# Patient Record
Sex: Male | Born: 1985 | Hispanic: Yes | Marital: Single | Smoking: Former smoker
Health system: Southern US, Community
[De-identification: ages and names within clinical notes are randomized; demographics above are authoritative.]

---
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
## 2008-04-11 ENCOUNTER — Emergency Department: Payer: Self-pay | Admitting: Emergency Medicine

## 2018-07-17 ENCOUNTER — Emergency Department
Admission: EM | Admit: 2018-07-17 | Discharge: 2018-07-17 | Payer: Self-pay | Attending: Student in an Organized Health Care Education/Training Program | Admitting: Student in an Organized Health Care Education/Training Program

## 2018-07-17 ENCOUNTER — Other Ambulatory Visit
Admission: RE | Admit: 2018-07-17 | Discharge: 2018-07-17 | Disposition: A | Discharge status: Home / Self Care | Payer: Self-pay | Attending: Family Medicine | Admitting: Family Medicine

## 2018-07-17 ENCOUNTER — Encounter: Payer: Self-pay | Admitting: Emergency Medicine

## 2018-07-17 ENCOUNTER — Other Ambulatory Visit: Payer: Self-pay

## 2018-07-17 DIAGNOSIS — Y999 Unspecified external cause status: Secondary | ICD-10-CM | POA: Insufficient documentation

## 2018-07-17 DIAGNOSIS — S0081XA Abrasion of other part of head, initial encounter: Secondary | ICD-10-CM | POA: Insufficient documentation

## 2018-07-17 DIAGNOSIS — Z87891 Personal history of nicotine dependence: Secondary | ICD-10-CM | POA: Insufficient documentation

## 2018-07-17 DIAGNOSIS — Y939 Activity, unspecified: Secondary | ICD-10-CM | POA: Insufficient documentation

## 2018-07-17 DIAGNOSIS — Y929 Unspecified place or not applicable: Secondary | ICD-10-CM | POA: Insufficient documentation
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
## 2018-07-17 NOTE — ED Notes (Signed)
Blood draw performed to left AC per this RN for [employer].
